# Patient Record
Sex: Male | Born: 1985 | Race: White | Hispanic: Yes | Marital: Married | Smoking: Current every day smoker
Health system: Southern US, Community
[De-identification: ages and names within clinical notes are randomized; demographics above are authoritative.]

## PROBLEM LIST (undated history)

## (undated) DIAGNOSIS — E162 Hypoglycemia, unspecified: Secondary | ICD-10-CM

## (undated) HISTORY — PX: OTHER SURGICAL HISTORY: SHX169

## (undated) HISTORY — PX: HEMATOMA EVACUATION: SHX5118

---

## 2012-10-29 ENCOUNTER — Encounter (HOSPITAL_COMMUNITY): Payer: Self-pay | Admitting: *Deleted

## 2012-10-29 ENCOUNTER — Emergency Department (HOSPITAL_COMMUNITY): Payer: Worker's Compensation

## 2012-10-29 ENCOUNTER — Emergency Department (HOSPITAL_COMMUNITY)
Admission: EM | Admit: 2012-10-29 | Discharge: 2012-10-29 | Disposition: A | Discharge status: Home / Self Care | Payer: Worker's Compensation | Attending: Emergency Medicine | Admitting: Emergency Medicine

## 2012-10-29 DIAGNOSIS — Z8639 Personal history of other endocrine, nutritional and metabolic disease: Secondary | ICD-10-CM | POA: Insufficient documentation

## 2012-10-29 DIAGNOSIS — W28XXXA Contact with powered lawn mower, initial encounter: Secondary | ICD-10-CM | POA: Insufficient documentation

## 2012-10-29 DIAGNOSIS — Y9389 Activity, other specified: Secondary | ICD-10-CM | POA: Insufficient documentation

## 2012-10-29 DIAGNOSIS — Y990 Civilian activity done for income or pay: Secondary | ICD-10-CM | POA: Insufficient documentation

## 2012-10-29 DIAGNOSIS — S60229A Contusion of unspecified hand, initial encounter: Secondary | ICD-10-CM | POA: Insufficient documentation

## 2012-10-29 DIAGNOSIS — Z862 Personal history of diseases of the blood and blood-forming organs and certain disorders involving the immune mechanism: Secondary | ICD-10-CM | POA: Insufficient documentation

## 2012-10-29 DIAGNOSIS — F17200 Nicotine dependence, unspecified, uncomplicated: Secondary | ICD-10-CM | POA: Insufficient documentation

## 2012-10-29 DIAGNOSIS — S60222A Contusion of left hand, initial encounter: Secondary | ICD-10-CM

## 2012-10-29 DIAGNOSIS — Y9289 Other specified places as the place of occurrence of the external cause: Secondary | ICD-10-CM | POA: Insufficient documentation

## 2012-10-29 HISTORY — DX: Hypoglycemia, unspecified: E16.2

## 2012-10-29 MED ORDER — DICLOFENAC SODIUM 75 MG PO TBEC: 75.0000 mg | DELAYED_RELEASE_TABLET | Freq: Two times a day (BID) | ORAL | Status: AC

## 2012-10-29 MED ORDER — HYDROCODONE-ACETAMINOPHEN 5-325 MG PO TABS: ORAL_TABLET | ORAL | Status: DC

## 2012-10-29 NOTE — ED Provider Notes (Signed)
History     CSN: [id number]  Arrival date & time 10/29/12  4098   First MD Initiated Contact with Patient 10/29/12 0800      Chief Complaint  Patient presents with  . Hand Injury    (Consider location/radiation/quality/duration/timing/severity/associated sxs/prior treatment) Patient is a 27 y.o. male presenting with hand injury. The history is provided by the patient.  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, mining transmissions Hand location:  L hand Pain details:    Quality:  Aching and throbbing   Severity:  Moderate   Onset quality:  Sudden   Timing:  Constant   Progression:  Worsening Chronicity:  New Handedness:  Ambidextrous Dislocation: no   Foreign body present:  No foreign bodies Prior injury to area:  No Relieved by:  Nothing Worsened by:  Movement Ineffective treatments:  NSAIDs and acetaminophen Associated symptoms: stiffness and tingling   Associated symptoms: no back pain and no neck pain   Risk factors: no known bone disorder and no frequent fractures     Past Medical History  Diagnosis Date  . Hypoglycemia     History reviewed. No pertinent past surgical history.  No family history on file.  History  Substance Use Topics  . Smoking status: Current Every Day Smoker  . Smokeless tobacco: Not on file  . Alcohol Use: No      Review of Systems  Constitutional: Negative for activity change.       All ROS Neg except as noted in HPI  HENT: Negative for nosebleeds and neck pain.   Eyes: Negative for photophobia and discharge.  Respiratory: Negative for cough, shortness of breath and wheezing.   Cardiovascular: Negative for chest pain and palpitations.  Gastrointestinal: Negative for abdominal pain and blood in stool.  Genitourinary: Negative for dysuria, frequency and hematuria.  Musculoskeletal: Positive for  stiffness. Negative for back pain and arthralgias.  Skin: Negative.   Neurological: Negative for dizziness, seizures and speech difficulty.  Psychiatric/Behavioral: Negative for hallucinations and confusion.    Allergies  Cinnamon and Penicillins  Home Medications  No current outpatient prescriptions on file.  BP 113/79  Pulse 68  Temp(Src) 98 F (36.7 C) (Oral)  Resp 16  Ht 5\' 10"  (1.778 m)  Wt 241 lb (109.317 kg)  BMI 34.58 kg/m2  SpO2 100%  Physical Exam  Nursing note and vitals reviewed. Constitutional: He is oriented to person, place, and time. He appears well-developed and well-nourished.  Non-toxic appearance.  HENT:  Head: Normocephalic.  Right Ear: Tympanic membrane and external ear normal.  Left Ear: Tympanic membrane and external ear normal.  Eyes: EOM and lids are normal. Pupils are equal, round, and reactive to light.  Neck: Normal range of motion. Neck supple. Carotid bruit is not present.  Cardiovascular: Normal rate, regular rhythm, normal heart sounds, intact distal pulses and normal pulses.   Pulmonary/Chest: Breath sounds normal. No respiratory distress.  Abdominal: Soft. Bowel sounds are normal. There is no tenderness. There is no guarding.  Musculoskeletal: Normal range of motion.  Bruise to the dorsum of the left hand. FROM of the fingers. No deformity or atrophy of the thenar areas. Cap refill less than 3 sec. Marland Kitchen FROM of the wrist, elbow and shoulder.   Lymphadenopathy:       Head (right side): No submandibular adenopathy present.  Head (left side): No submandibular adenopathy present.    He has no cervical adenopathy.  Neurological: He is alert and oriented to person, place, and time. He has normal strength. No cranial nerve deficit or sensory deficit.  Skin: Skin is warm and dry.  Psychiatric: He has a normal mood and affect. His speech is normal.    ED Course  Procedures (including critical care time)  Labs Reviewed - No data to  display Dg Hand Complete Left  10/29/2012  *RADIOLOGY REPORT*  Clinical Data: Left hand injury at work.  Pain and swelling of the middle finger.  LEFT HAND - COMPLETE 3+ VIEW  Comparison: None.  Findings: The mineralization and alignment are normal.  There is no evidence of acute fracture or dislocation.  No foreign body or focal soft tissue swelling is evident.  IMPRESSION: No acute osseous findings.   Original Report Authenticated By: Carey Bullocks, M.D.    Pulse oximetry 100% on room air. Within normal limits by my interpretation.  No diagnosis found.    MDM  I have reviewed nursing notes, vital signs, and all appropriate lab and imaging results for this patient. 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 X-ray of the left hand is negative for fracture or dislocation. Plan Rx for diclofenac and norco given. Pt referred to orthopedics if not improving.      Kathie Dike, PA-C 10/30/12 1646

## 2012-10-29 NOTE — ED Notes (Signed)
Pt slammed his left hand against transmissions at work, pt c/o pain that begins with left middle finger and extends up left hand, cms intact

## 2012-11-07 NOTE — ED Provider Notes (Signed)
Medical screening examination/treatment/procedure(s) were performed by non-physician practitioner and as supervising physician I was immediately available for consultation/collaboration.  Donnetta Hutching, MD 11/07/12 1209

## 2013-04-30 ENCOUNTER — Emergency Department: Payer: Self-pay | Admitting: Internal Medicine

## 2013-05-30 ENCOUNTER — Emergency Department: Payer: Self-pay | Admitting: Emergency Medicine

## 2013-08-30 ENCOUNTER — Emergency Department: Payer: Self-pay | Admitting: Emergency Medicine

## 2013-08-30 LAB — URINALYSIS, COMPLETE
BILIRUBIN, UR: NEGATIVE
BLOOD: NEGATIVE
Glucose,UR: NEGATIVE mg/dL (ref 0–75)
Leukocyte Esterase: NEGATIVE
Nitrite: NEGATIVE
PH: 5 (ref 4.5–8.0)
Protein: 30
SPECIFIC GRAVITY: 1.028 (ref 1.003–1.030)
Squamous Epithelial: 1

## 2013-08-30 LAB — CBC WITH DIFFERENTIAL/PLATELET
Basophil #: 0 10*3/uL (ref 0.0–0.1)
Basophil %: 0.4 %
EOS PCT: 4.4 %
Eosinophil #: 0.4 10*3/uL (ref 0.0–0.7)
HCT: 39.9 % — AB (ref 40.0–52.0)
HGB: 13.2 g/dL (ref 13.0–18.0)
LYMPHS ABS: 2.6 10*3/uL (ref 1.0–3.6)
Lymphocyte %: 26.8 %
MCH: 27.6 pg (ref 26.0–34.0)
MCHC: 33.1 g/dL (ref 32.0–36.0)
MCV: 84 fL (ref 80–100)
Monocyte #: 0.6 x10 3/mm (ref 0.2–1.0)
Monocyte %: 5.9 %
NEUTROS PCT: 62.5 %
Neutrophil #: 6.1 10*3/uL (ref 1.4–6.5)
Platelet: 277 10*3/uL (ref 150–440)
RBC: 4.78 10*6/uL (ref 4.40–5.90)
RDW: 15.3 % — AB (ref 11.5–14.5)
WBC: 9.8 10*3/uL (ref 3.8–10.6)

## 2013-08-30 LAB — COMPREHENSIVE METABOLIC PANEL
ALK PHOS: 142 U/L — AB
ALT: 25 U/L (ref 12–78)
Albumin: 3.9 g/dL (ref 3.4–5.0)
Anion Gap: 4 — ABNORMAL LOW (ref 7–16)
BUN: 14 mg/dL (ref 7–18)
Bilirubin,Total: 0.4 mg/dL (ref 0.2–1.0)
CALCIUM: 9.1 mg/dL (ref 8.5–10.1)
CHLORIDE: 106 mmol/L (ref 98–107)
Co2: 28 mmol/L (ref 21–32)
Creatinine: 0.7 mg/dL (ref 0.60–1.30)
EGFR (Non-African Amer.): >60
GLUCOSE: 101 mg/dL — AB (ref 65–99)
OSMOLALITY: 276 (ref 275–301)
Potassium: 4.2 mmol/L (ref 3.5–5.1)
SGOT(AST): 31 U/L (ref 15–37)
SODIUM: 138 mmol/L (ref 136–145)
Total Protein: 8.3 g/dL — ABNORMAL HIGH (ref 6.4–8.2)

## 2013-08-30 LAB — DRUG SCREEN, URINE
Amphetamines, Ur Screen: NEGATIVE (ref ?–1000)
BARBITURATES, UR SCREEN: NEGATIVE (ref ?–200)
Benzodiazepine, Ur Scrn: NEGATIVE (ref ?–200)
CANNABINOID 50 NG, UR ~~LOC~~: NEGATIVE (ref ?–50)
Cocaine Metabolite,Ur ~~LOC~~: NEGATIVE (ref ?–300)
MDMA (ECSTASY) UR SCREEN: NEGATIVE (ref ?–500)
Methadone, Ur Screen: NEGATIVE (ref ?–300)
OPIATE, UR SCREEN: NEGATIVE (ref ?–300)
PHENCYCLIDINE (PCP) UR S: NEGATIVE (ref ?–25)
Tricyclic, Ur Screen: NEGATIVE (ref ?–1000)

## 2013-08-30 LAB — LIPASE, BLOOD: LIPASE: 120 U/L (ref 73–393)

## 2013-11-27 ENCOUNTER — Emergency Department: Payer: Self-pay | Admitting: Emergency Medicine

## 2013-12-15 ENCOUNTER — Emergency Department: Payer: Self-pay | Admitting: Internal Medicine

## 2014-01-02 ENCOUNTER — Emergency Department: Payer: Self-pay | Admitting: Emergency Medicine

## 2014-01-02 LAB — CBC WITH DIFFERENTIAL/PLATELET
BASOS PCT: 0.4 %
Basophil #: 0.1 10*3/uL (ref 0.0–0.1)
EOS ABS: 0.3 10*3/uL (ref 0.0–0.7)
EOS PCT: 2.8 %
HCT: 38.2 % — ABNORMAL LOW (ref 40.0–52.0)
HGB: 12.3 g/dL — AB (ref 13.0–18.0)
Lymphocyte #: 3 10*3/uL (ref 1.0–3.6)
Lymphocyte %: 25.9 %
MCH: 26.9 pg (ref 26.0–34.0)
MCHC: 32.3 g/dL (ref 32.0–36.0)
MCV: 83 fL (ref 80–100)
MONOS PCT: 6.2 %
Monocyte #: 0.7 x10 3/mm (ref 0.2–1.0)
NEUTROS ABS: 7.5 10*3/uL — AB (ref 1.4–6.5)
Neutrophil %: 64.7 %
Platelet: 286 10*3/uL (ref 150–440)
RBC: 4.59 10*6/uL (ref 4.40–5.90)
RDW: 15.3 % — AB (ref 11.5–14.5)
WBC: 11.6 10*3/uL — ABNORMAL HIGH (ref 3.8–10.6)

## 2014-01-02 LAB — COMPREHENSIVE METABOLIC PANEL
Albumin: 3.6 g/dL (ref 3.4–5.0)
Alkaline Phosphatase: 137 U/L — ABNORMAL HIGH
Anion Gap: 3 — ABNORMAL LOW (ref 7–16)
BILIRUBIN TOTAL: 0.2 mg/dL (ref 0.2–1.0)
BUN: 15 mg/dL (ref 7–18)
CHLORIDE: 109 mmol/L — AB (ref 98–107)
CO2: 28 mmol/L (ref 21–32)
CREATININE: 0.84 mg/dL (ref 0.60–1.30)
Calcium, Total: 8.9 mg/dL (ref 8.5–10.1)
EGFR (Non-African Amer.): >60
GLUCOSE: 81 mg/dL (ref 65–99)
OSMOLALITY: 279 (ref 275–301)
Potassium: 3.8 mmol/L (ref 3.5–5.1)
SGOT(AST): 26 U/L (ref 15–37)
SGPT (ALT): 24 U/L (ref 12–78)
SODIUM: 140 mmol/L (ref 136–145)
Total Protein: 7.8 g/dL (ref 6.4–8.2)

## 2014-01-02 LAB — URINALYSIS, COMPLETE
BLOOD: NEGATIVE
Bacteria: NONE SEEN
Bilirubin,UR: NEGATIVE
Glucose,UR: NEGATIVE mg/dL (ref 0–75)
Ketone: NEGATIVE
Leukocyte Esterase: NEGATIVE
NITRITE: NEGATIVE
PH: 7 (ref 4.5–8.0)
PROTEIN: NEGATIVE
RBC,UR: <1 /HPF (ref 0–5)
SPECIFIC GRAVITY: 1.019 (ref 1.003–1.030)

## 2014-01-02 LAB — LIPASE, BLOOD: Lipase: 160 U/L (ref 73–393)

## 2015-05-13 ENCOUNTER — Ambulatory Visit (INDEPENDENT_AMBULATORY_CARE_PROVIDER_SITE_OTHER): Payer: BLUE CROSS/BLUE SHIELD | Admitting: *Deleted

## 2015-05-13 DIAGNOSIS — Z23 Encounter for immunization: Secondary | ICD-10-CM | POA: Diagnosis not present

## 2016-06-04 ENCOUNTER — Emergency Department (HOSPITAL_COMMUNITY)
Admission: EM | Admit: 2016-06-04 | Discharge: 2016-06-04 | Disposition: A | Discharge status: Home / Self Care | Payer: Self-pay | Attending: Emergency Medicine | Admitting: Emergency Medicine

## 2016-06-04 ENCOUNTER — Emergency Department (HOSPITAL_COMMUNITY): Payer: Self-pay

## 2016-06-04 ENCOUNTER — Encounter (HOSPITAL_COMMUNITY): Payer: Self-pay | Admitting: Emergency Medicine

## 2016-06-04 DIAGNOSIS — J069 Acute upper respiratory infection, unspecified: Secondary | ICD-10-CM | POA: Insufficient documentation

## 2016-06-04 DIAGNOSIS — J9801 Acute bronchospasm: Secondary | ICD-10-CM | POA: Insufficient documentation

## 2016-06-04 DIAGNOSIS — Z791 Long term (current) use of non-steroidal anti-inflammatories (NSAID): Secondary | ICD-10-CM | POA: Insufficient documentation

## 2016-06-04 DIAGNOSIS — Z87891 Personal history of nicotine dependence: Secondary | ICD-10-CM | POA: Insufficient documentation

## 2016-06-04 DIAGNOSIS — Z79899 Other long term (current) drug therapy: Secondary | ICD-10-CM | POA: Insufficient documentation

## 2016-06-04 MED ORDER — ALBUTEROL SULFATE HFA 108 (90 BASE) MCG/ACT IN AERS
2.0000 | INHALATION_SPRAY | RESPIRATORY_TRACT | Status: DC | PRN
  Administered 2016-06-04: 2 via RESPIRATORY_TRACT
  Filled 2016-06-04: qty 6.7

## 2016-06-04 MED ORDER — AMOXICILLIN 500 MG PO CAPS: 1000.0000 mg | ORAL_CAPSULE | Freq: Two times a day (BID) | ORAL | 0 refills | Status: AC

## 2016-06-04 NOTE — ED Provider Notes (Signed)
[facility]-EMERGENCY DEPT Provider Note   CSN: [id number] Arrival date & time: 06/04/16  1841     History   Chief Complaint Chief Complaint  Patient presents with  . Cough    HPI John Slaughterlex Tate is a 30 y.o. male.  Cough, congestion, sore throat, intermittent fever x 3 weeks. He has tried multiple OTC medications without relief. He feels the cough is getting worse and is here for evaluation. No vomiting, change in appetite. He is a smoker and stopped smoking at onset of symptoms.     Cough  Associated symptoms include rhinorrhea, sore throat and shortness of breath. Pertinent negatives include no chest pain and no chills.    History reviewed. No pertinent past medical history.  There are no active problems to display for this patient.   History reviewed. No pertinent surgical history.     Home Medications    Prior to Admission medications   Not on File    Family History Family History  Problem Relation Age of Onset  . Cancer Mother     Social History Social History  Substance Use Topics  . Smoking status: Former Games developermoker  . Smokeless tobacco: Never Used  . Alcohol use No     Allergies   Review of patient's allergies indicates no known allergies.   Review of Systems Review of Systems  Constitutional: Negative for chills and fever.  HENT: Positive for congestion, rhinorrhea and sore throat.   Respiratory: Positive for cough and shortness of breath.   Cardiovascular: Negative for chest pain.       Chest pain with cough.  Gastrointestinal: Negative.  Negative for nausea and vomiting.  Musculoskeletal: Negative.   Skin: Negative.   Neurological: Negative.      Physical Exam Updated Vital Signs BP 104/62   Pulse 97   Temp 98.4 F (36.9 C)   Ht 5\' 11"  (1.803 m)   Wt 119.3 kg   SpO2 95%   BMI 36.68 kg/m   Physical Exam  Constitutional: He is oriented to person, place, and time. He appears well-developed and well-nourished.  HENT:  Head:  Normocephalic.  Right Ear: Tympanic membrane normal.  Left Ear: Tympanic membrane normal.  Nose: Mucosal edema present.  Neck: Normal range of motion. Neck supple.  Cardiovascular: Normal rate and regular rhythm.   No murmur heard. Pulmonary/Chest: Effort normal. He has wheezes. He exhibits no tenderness.  Abdominal: Soft. Bowel sounds are normal. There is no tenderness. There is no rebound and no guarding.  Musculoskeletal: Normal range of motion.  Neurological: He is alert and oriented to person, place, and time.  Skin: Skin is warm and dry. No rash noted.  Psychiatric: He has a normal mood and affect.     ED Treatments / Results  Labs (all labs ordered are listed, but only abnormal results are displayed) Labs Reviewed - No data to display  EKG  EKG Interpretation None       Radiology No results found.  Procedures Procedures (including critical care time)  Medications Ordered in ED Medications - No data to display   Initial Impression / Assessment and Plan / ED Course  I have reviewed the triage vital signs and the nursing notes.  Pertinent labs & imaging results that were available during my care of the patient were reviewed by me and considered in my medical decision making (see chart for details).  Clinical Course    Patient with ongoing symptoms of URI, intermittent fevers, symptoms x 3 weeks. He is  wheezing today, expiratory. Will provide inhaler, abx, recommend continued OTC medications. Follow up with PCP  Final Clinical Impressions(s) / ED Diagnoses   Final diagnoses:  None   1. URI 2. Bronchospasm  New Prescriptions New Prescriptions   No medications on file     Elpidio AnisShari Theotis Gerdeman, Cordelia Poche-C 06/04/16 1942    Jacalyn LefevreJulie Haviland, MD 06/04/16 2145

## 2016-06-04 NOTE — ED Triage Notes (Signed)
Pt reports cough x 3 weeks. Non productive.

## 2018-07-17 ENCOUNTER — Emergency Department (HOSPITAL_COMMUNITY): Admission: EM | Admit: 2018-07-17 | Discharge: 2018-07-17 | Discharge status: Absent without leave | Payer: Self-pay

## 2018-07-17 ENCOUNTER — Other Ambulatory Visit: Payer: Self-pay

## 2018-07-17 ENCOUNTER — Emergency Department (HOSPITAL_COMMUNITY)
Admission: EM | Admit: 2018-07-17 | Discharge: 2018-07-17 | Disposition: A | Discharge status: Home / Self Care | Payer: Self-pay | Attending: Emergency Medicine | Admitting: Emergency Medicine

## 2018-07-17 ENCOUNTER — Encounter (HOSPITAL_COMMUNITY): Payer: Self-pay | Admitting: Emergency Medicine

## 2018-07-17 DIAGNOSIS — K0889 Other specified disorders of teeth and supporting structures: Secondary | ICD-10-CM | POA: Insufficient documentation

## 2018-07-17 DIAGNOSIS — F17210 Nicotine dependence, cigarettes, uncomplicated: Secondary | ICD-10-CM | POA: Insufficient documentation

## 2018-07-17 MED ORDER — NAPROXEN 500 MG PO TABS: 500.0000 mg | ORAL_TABLET | Freq: Two times a day (BID) | ORAL | 0 refills | Status: AC

## 2018-07-17 MED ORDER — CLINDAMYCIN HCL 150 MG PO CAPS: 450.0000 mg | ORAL_CAPSULE | Freq: Three times a day (TID) | ORAL | 0 refills | Status: AC

## 2018-07-17 MED ORDER — CHLORHEXIDINE GLUCONATE 0.12% ORAL RINSE (MEDLINE KIT): 15.0000 mL | Freq: Two times a day (BID) | OROMUCOSAL | 0 refills | Status: AC

## 2018-07-17 NOTE — ED Triage Notes (Signed)
Patient c/o right lower dental pain with swelling. Per patient intermittent x1 month but worse the past 2 days. Patient reports using multiple over the counter medications with no relief-[facility]. Per patient he has felt like his has had fever, chills.

## 2018-07-17 NOTE — Discharge Instructions (Addendum)
1. Medications: Clindamycin (antibiotic), naproxen for pain (do not combine with other medications except tylenol), chlorhexidine mouth solution, usual home medications 2. Treatment: rest, drink plenty of fluids, take medications as prescribed 3. Follow Up: Please follow up with dentistry within 1 week for discussion of your diagnoses and further evaluation after today's visit; if you do not have a primary care doctor use the resource guide provided to find one; Return to the ER for high fevers, difficulty breathing, difficulty swallowing or other concerning symptoms  Apply warm compresses to jaw throughout the day. Take antibiotic and completion. Follow up with a dentist is very important for ongoing evaluation and management of recurrent dental pain. Have her return to emergency department for emergent changing or worsening symptoms.  I have prescribed you an antibiotic to treat the infection and Naproxen which is an anti-inflammatory medicine to treat the pain.   Please take all of your antibiotics until finished. You may develop abdominal discomfort or diarrhea from the antibiotic.  You may help offset this with probiotics which you can buy at the store (ask your pharmacist if unable to find) or get probiotics in the form of eating yogurt. Do not eat or take the probiotics until 2 hours after your antibiotic. If you are unable to tolerate these side effects follow-up with your primary care provider or return to the emergency department.   If you begin to experience any blistering, rashes, swelling, or difficulty breathing seek medical care for evaluation of potentially more serious side effects.   Be sure to eat something when taking the Naproxen as it can cause stomach upset and at worst stomach bleeding. Do not take additional non steroidal anti-inflammatory medicines such as Ibuprofen, Aleve, Advil, Mobic, Diclofenac, or goodie powder while taking Naproxen. You may supplement with Tylenol.    Please be aware that these medications may interact with other medications you are taking, please be sure to discuss your medication list with your pharmacist.  If you start to experience and new or worsening symptoms return to the emergency department. If you start to experience fever, chills, neck stiffness/pain, or inability to move your neck or open your mouth come back to the emergency department immediately.   You have a dental problem. Use the resource guide listed below to help you find a dentist if you do not already have one to follow up with. It is very important that you get evaluated by a dentist as soon as possible. Call tomorrow to schedule an appointment. Read the instructions below.  Eat a soft or liquid diet and rinse your mouth out after meals with warm water. You should see a dentist or return here at once if you have increased swelling, increased pain or uncontrolled bleeding from the site of your injury.   SEEK MEDICAL CARE IF:  You have increased pain not controlled with medicines.  You have swelling around your tooth, in your face or neck.  You have bleeding which starts, continues, or gets worse.  You have a fever >101 If you are unable to open your mouth  RESOURCE GUIDE  Dental Problems  Patients with Medicaid: [facility]                     [facility] [phone]  [address] Phone:  [phone]                                                  Phone:  [phone]  If unable to pay or uninsured, contact:  [facility] or [facility]. to become qualified for the adult dental clinic.  Chronic Pain Problems Contact [facility]  [phone] Patients need to be referred by their primary care doctor.  Insufficient Money for Medicine Contact [employer]:  call "211" or [facility] [phone]) 419-5720.  No Primary Care Doctor Call [facility]   450-343-6980 Other agencies that provide inexpensive medical care    [facility]  [phone]    [facility]  [phone]    [facility]  [phone]    [facility]  564-642-9114    [facility]  979-597-6740    [facility]  [phone]  Psychological Services [facility]  [phone] [facility]  [phone] [facility]   [phone] (emergency services [phone])  Substance Abuse Resources [facility]  [phone] [facility] 7267565663 [facility] 7018778626 Floydene Flock ([phone] Residential & Outpatient Substance Abuse Program  616-668-3422  Abuse/Neglect [facility] Child Abuse Hotline 614-160-8516 [facility] Child Abuse Hotline 7657695264 (After Hours)  Emergency Shelter [facility] 4584529735  Maternity Homes [facility] at the [place] [facility] [place] 234-511-0260 [facility] (519)456-6218  MRSA Hotline #:   (929)565-6673    [facility] Resources  [facility]     [employer]                          [facility] Dept. [address] [place]                       [address]      [address]  Blondell Reveal Phone:  [phone]                                   Phone:  [phone]                 Phone:  [phone]  [facility] Phone:  [phone]  [facility] Child Abuse Hotline 604-132-9336 [phone] (After Hours)

## 2018-07-17 NOTE — ED Notes (Signed)
Pt returned call to er stating that he could not afford the prescription of clindamycin that was given to him, RN spoke with Dr Clarene Dukemcmanus who changed prescription to erythromycin 250 mg po every 6 hours for 5 days, [facility] [place] [facility]'s request pt was notified of change in prescription at [phone]

## 2018-07-17 NOTE — ED Provider Notes (Addendum)
[facility] EMERGENCY DEPARTMENT Provider Note   CSN: [id number] Arrival date & time: 07/17/18  5009     History   Chief Complaint Chief Complaint  Patient presents with  . Dental Pain    HPI John Tate is a 32 y.o. male presenting with right sided dental pain onset 1 month ago after breaking a tooth while eating. Patient describes pain as a constant throbbing and states it has worsened in the last 2 days. Patient has tried ibuprofen, motrin, and BC powder with partial relief. Patient states pain is worse with laying down and chewing. Patient states he has multiple broken teeth and cavities. Patient states he has a dentist appointment on January 12th when his insurance is accepted. Patient reports subjective fever, right sided facial edema, and slight trouble swallowing. Pt denies drooling, shortness of breath, neck pain, abdominal pain, nausea, or vomiting.   HPI  Past Medical History:  Diagnosis Date  . Hypoglycemia     There are no active problems to display for this patient.   Past Surgical History:  Procedure Laterality Date  . head surgery    . HEMATOMA EVACUATION          Home Medications    Prior to Admission medications   Medication Sig Start Date End Date Taking? Authorizing Provider  acetaminophen (TYLENOL) 500 MG tablet Take 2,000 mg by mouth every 6 (six) hours as needed for pain.   Yes [provider]  ibuprofen (ADVIL,MOTRIN) 200 MG tablet Take 800 mg by mouth every 6 (six) hours as needed for pain.   Yes [provider]  chlorhexidine gluconate, MEDLINE KIT, (PERIDEX) 0.12 % solution Use as directed 15 mLs in the mouth or throat 2 (two) times daily. 07/17/18   Darlin Drop P, PA-C  clindamycin (CLEOCIN) 150 MG capsule Take 3 capsules (450 mg total) by mouth 3 (three) times daily for 7 days. 07/17/18 07/24/18  Darlin Drop P, PA-C  naproxen (NAPROSYN) 500 MG tablet Take 1 tablet (500 mg total) by mouth 2 (two) times daily. 07/17/18    Arville Lime, PA-C    Family History Family History  Problem Relation Age of Onset  . Kidney failure Father     Social History Social History   Tobacco Use  . Smoking status: Current Every Day Smoker    Packs/day: 0.04    Years: 20.00    Pack years: 0.80    Types: Cigarettes  . Smokeless tobacco: Never Used  Substance Use Topics  . Alcohol use: No  . Drug use: No     Allergies   Cinnamon and Penicillins   Review of Systems Review of Systems  Constitutional: Positive for chills and fever. Negative for diaphoresis.  HENT: Positive for dental problem, facial swelling and trouble swallowing. Negative for drooling, ear pain, sore throat and voice change.   Respiratory: Negative for cough and shortness of breath.   Cardiovascular: Negative for chest pain.  Gastrointestinal: Negative for abdominal pain, nausea and vomiting.  Endocrine: Negative for cold intolerance and heat intolerance.  Musculoskeletal: Negative for neck pain.  Skin: Negative for color change.  Allergic/Immunologic: Negative for immunocompromised state.  Hematological: Negative for adenopathy.     Physical Exam Updated Vital Signs BP 118/87 (BP Location: Left Arm)   Pulse 71   Temp 97.9 F (36.6 C) (Oral)   Resp 18   Ht 5' 11"  (1.803 m)   Wt 118.8 kg   SpO2 97%   BMI 36.54 kg/m   Physical  Exam  Constitutional: Pt appears well-developed and well-nourished.  HENT:  Head: Normocephalic. Mild right sided facial edema noted. Right Ear: Tympanic membrane, external ear and ear canal normal.  Left Ear: Tympanic membrane, external ear and ear canal normal.  Nose: Nose normal. Right sinus exhibits no maxillary sinus tenderness and no frontal sinus tenderness. Left sinus exhibits no maxillary sinus tenderness and no frontal sinus tenderness.  Mouth/Throat: Uvula is midline, oropharynx is clear and moist and mucous membranes are normal. No oral lesions. Abnormal dentition. Dental caries and broken  teeth present. No uvula swelling or lacerations. No oropharyngeal exudate, posterior oropharyngeal edema, posterior oropharyngeal erythema or tonsillar abscesses.  No gingival swelling, fluctuance or induration. No gross abscess. No signs of Ludwig's angina noted. Eyes: Conjunctivae are normal. Pupils are equal, round, and reactive to light. Right eye exhibits no discharge. Left eye exhibits no discharge.  Neck: Normal range of motion. Neck supple. No stridor. Handling secretions without difficulty. No nuchal rigidity. No cervical lymphadenopathy. Cardiovascular: Normal rate, regular rhythm and normal heart sounds.   Pulmonary/Chest: Effort normal. No respiratory distress. Equal chest rise. Abdominal: Abdomen is soft and non tender. Pt exhibits no distension.  Lymphadenopathy: Pt has no cervical adenopathy.  Neurological: Pt is alert.  Skin: Skin is warm and dry.  Psychiatric: Pt has a normal mood and affect.  Nursing note and vitals reviewed.   ED Treatments / Results  Labs (all labs ordered are listed, but only abnormal results are displayed) Labs Reviewed - No data to display  EKG None  Radiology No results found.  Procedures Procedures (including critical care time)  Medications Ordered in ED Medications - No data to display   Initial Impression / Assessment and Plan / ED Course  I have reviewed the triage vital signs and the nursing notes.  Pertinent labs & imaging results that were available during my care of the patient were reviewed by me and considered in my medical decision making (see chart for details).    Patient with toothache with signs of infection.  No gross abscess.  Exam unconcerning for Ludwig's angina.  Will treat with clindamycin due to penicillin allergy and anti-inflammatories medicine.  Instructed patient to not combine over the counter Urged patient to follow-up with dentist. Advised patient to return to ER if he experiences new or worsening  symptoms.   Final Clinical Impressions(s) / ED Diagnoses   Final diagnoses:  Pain, dental    ED Discharge Orders         Ordered    naproxen (NAPROSYN) 500 MG tablet  2 times daily     07/17/18 1036    clindamycin (CLEOCIN) 150 MG capsule  3 times daily     07/17/18 1036    chlorhexidine gluconate, MEDLINE KIT, (PERIDEX) 0.12 % solution  2 times daily     07/17/18 1122              Darlin Drop [place], [place] 07/17/18 1122    Fredia Sorrow, MD 07/20/18 1052

## 2018-07-18 ENCOUNTER — Encounter (HOSPITAL_COMMUNITY): Payer: Self-pay | Admitting: Emergency Medicine

## 2019-05-16 ENCOUNTER — Other Ambulatory Visit: Payer: Self-pay | Admitting: *Deleted

## 2019-05-16 DIAGNOSIS — Z20822 Contact with and (suspected) exposure to covid-19: Secondary | ICD-10-CM

## 2019-05-18 LAB — NOVEL CORONAVIRUS, NAA: SARS-CoV-2, NAA: NOT DETECTED

## 2019-05-19 ENCOUNTER — Telehealth: Payer: Self-pay | Admitting: General Practice

## 2019-05-19 NOTE — Telephone Encounter (Signed)
Gave patient covid test results °Patient understood  °

## 2021-12-02 ENCOUNTER — Emergency Department (HOSPITAL_COMMUNITY)
Admission: EM | Admit: 2021-12-02 | Discharge: 2021-12-02 | Disposition: A | Discharge status: Home / Self Care | Payer: Self-pay | Attending: Emergency Medicine | Admitting: Emergency Medicine

## 2021-12-02 ENCOUNTER — Emergency Department (HOSPITAL_COMMUNITY): Payer: Self-pay

## 2021-12-02 ENCOUNTER — Encounter (HOSPITAL_COMMUNITY): Payer: Self-pay | Admitting: Emergency Medicine

## 2021-12-02 ENCOUNTER — Other Ambulatory Visit: Payer: Self-pay

## 2021-12-02 DIAGNOSIS — W3400XA Accidental discharge from unspecified firearms or gun, initial encounter: Secondary | ICD-10-CM | POA: Insufficient documentation

## 2021-12-02 DIAGNOSIS — S71102A Unspecified open wound, left thigh, initial encounter: Secondary | ICD-10-CM | POA: Insufficient documentation

## 2021-12-02 DIAGNOSIS — Z23 Encounter for immunization: Secondary | ICD-10-CM | POA: Insufficient documentation

## 2021-12-02 DIAGNOSIS — S71101A Unspecified open wound, right thigh, initial encounter: Secondary | ICD-10-CM | POA: Insufficient documentation

## 2021-12-02 LAB — CBC
HCT: 41.8 % (ref 39.0–52.0)
Hemoglobin: 13.8 g/dL (ref 13.0–17.0)
MCH: 29.2 pg (ref 26.0–34.0)
MCHC: 33 g/dL (ref 30.0–36.0)
MCV: 88.6 fL (ref 80.0–100.0)
Platelets: 278 10*3/uL (ref 150–400)
RBC: 4.72 MIL/uL (ref 4.22–5.81)
RDW: 14.3 % (ref 11.5–15.5)
WBC: 12.7 10*3/uL — ABNORMAL HIGH (ref 4.0–10.5)
nRBC: 0 % (ref 0.0–0.2)

## 2021-12-02 LAB — COMPREHENSIVE METABOLIC PANEL
ALT: 15 U/L (ref 0–44)
AST: 17 U/L (ref 15–41)
Albumin: 3.5 g/dL (ref 3.5–5.0)
Alkaline Phosphatase: 88 U/L (ref 38–126)
Anion gap: 5 (ref 5–15)
BUN: 10 mg/dL (ref 6–20)
CO2: 26 mmol/L (ref 22–32)
Calcium: 8.6 mg/dL — ABNORMAL LOW (ref 8.9–10.3)
Chloride: 108 mmol/L (ref 98–111)
Creatinine, Ser: 0.8 mg/dL (ref 0.61–1.24)
GFR, Estimated: >60 mL/min (ref >60)
Glucose, Bld: 119 mg/dL — ABNORMAL HIGH (ref 70–99)
Potassium: 3.7 mmol/L (ref 3.5–5.1)
Sodium: 139 mmol/L (ref 135–145)
Total Bilirubin: 0.4 mg/dL (ref 0.3–1.2)
Total Protein: 6.4 g/dL — ABNORMAL LOW (ref 6.5–8.1)

## 2021-12-02 LAB — LACTIC ACID, PLASMA: Lactic Acid, Venous: 1.2 mmol/L (ref 0.5–1.9)

## 2021-12-02 LAB — SAMPLE TO BLOOD BANK

## 2021-12-02 LAB — I-STAT CHEM 8, ED
BUN: 12 mg/dL (ref 6–20)
Calcium, Ion: 1.15 mmol/L (ref 1.15–1.40)
Chloride: 103 mmol/L (ref 98–111)
Creatinine, Ser: 0.7 mg/dL (ref 0.61–1.24)
Glucose, Bld: 113 mg/dL — ABNORMAL HIGH (ref 70–99)
HCT: 42 % (ref 39.0–52.0)
Hemoglobin: 14.3 g/dL (ref 13.0–17.0)
Potassium: 3.8 mmol/L (ref 3.5–5.1)
Sodium: 141 mmol/L (ref 135–145)
TCO2: 28 mmol/L (ref 22–32)

## 2021-12-02 LAB — ETHANOL: Alcohol, Ethyl (B): <10 mg/dL (ref <10)

## 2021-12-02 LAB — PROTIME-INR
INR: 1 (ref 0.8–1.2)
Prothrombin Time: 13.3 seconds (ref 11.4–15.2)

## 2021-12-02 MED ORDER — OXYCODONE-ACETAMINOPHEN 5-325 MG PO TABS: 1.0000 | ORAL_TABLET | Freq: Four times a day (QID) | ORAL | 0 refills | Status: AC | PRN

## 2021-12-02 MED ORDER — CEPHALEXIN 500 MG PO CAPS: 500.0000 mg | ORAL_CAPSULE | Freq: Four times a day (QID) | ORAL | 0 refills | Status: DC

## 2021-12-02 MED ORDER — TETANUS-DIPHTH-ACELL PERTUSSIS 5-2.5-18.5 LF-MCG/0.5 IM SUSY
0.5000 mL | PREFILLED_SYRINGE | Freq: Once | INTRAMUSCULAR | Status: AC
  Administered 2021-12-02: 0.5 mL via INTRAMUSCULAR
  Filled 2021-12-02: qty 0.5

## 2021-12-02 MED ORDER — OXYCODONE-ACETAMINOPHEN 5-325 MG PO TABS: 1.0000 | ORAL_TABLET | Freq: Four times a day (QID) | ORAL | 0 refills | Status: DC | PRN

## 2021-12-02 MED ORDER — HYDROCODONE-ACETAMINOPHEN 5-325 MG PO TABS
1.0000 | ORAL_TABLET | Freq: Once | ORAL | Status: AC
Start: 2021-12-02 — End: 2021-12-02
  Administered 2021-12-02: 1 via ORAL
  Filled 2021-12-02: qty 1

## 2021-12-02 MED ORDER — HYDROCODONE-ACETAMINOPHEN 5-325 MG PO TABS
1.0000 | ORAL_TABLET | Freq: Once | ORAL | Status: AC
  Administered 2021-12-02: 1 via ORAL
  Filled 2021-12-02: qty 1

## 2021-12-02 MED ORDER — CEPHALEXIN 500 MG PO CAPS: 500.0000 mg | ORAL_CAPSULE | Freq: Four times a day (QID) | ORAL | 0 refills | Status: AC

## 2021-12-02 NOTE — Discharge Instructions (Signed)
Keep wounds clean and dressed.  Ambulate as tolerated.  Take medications as prescribed.  Do not drive while taking Percocet. ?
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
## 2021-12-02 NOTE — ED Notes (Signed)
Attempted to ambulate pt

## 2021-12-02 NOTE — Progress Notes (Signed)
Orthopedic Tech Progress Note ?Patient Details:  ?John Tate ?May 10, 1986 ?[id number] ? ?Patient ID: John Tate, male   DOB: [birth year], 36 y.o.   MRN: [id number] ?Level I; not needed at the moment. ? ?[place] ?12/02/2021, 1:59 AM ? ?

## 2021-12-02 NOTE — ED Notes (Signed)
Dressing/Wound Care provided ?

## 2021-12-02 NOTE — ED Notes (Signed)
Crutches provided to pt. Pt. taught how to ambulate on crutches, teach back was observed.  ?
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
## 2023-01-12 ENCOUNTER — Emergency Department (HOSPITAL_BASED_OUTPATIENT_CLINIC_OR_DEPARTMENT_OTHER)
Admission: EM | Admit: 2023-01-12 | Discharge: 2023-01-13 | Disposition: A | Discharge status: Home / Self Care | Payer: Worker's Compensation | Attending: Emergency Medicine | Admitting: Emergency Medicine

## 2023-01-12 ENCOUNTER — Encounter (HOSPITAL_BASED_OUTPATIENT_CLINIC_OR_DEPARTMENT_OTHER): Payer: Self-pay

## 2023-01-12 ENCOUNTER — Other Ambulatory Visit: Payer: Self-pay

## 2023-01-12 DIAGNOSIS — W010XXA Fall on same level from slipping, tripping and stumbling without subsequent striking against object, initial encounter: Secondary | ICD-10-CM | POA: Insufficient documentation

## 2023-01-12 DIAGNOSIS — S8391XA Sprain of unspecified site of right knee, initial encounter: Secondary | ICD-10-CM | POA: Insufficient documentation

## 2023-01-12 DIAGNOSIS — M25561 Pain in right knee: Secondary | ICD-10-CM | POA: Diagnosis present
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
## 2023-01-13 ENCOUNTER — Emergency Department (HOSPITAL_BASED_OUTPATIENT_CLINIC_OR_DEPARTMENT_OTHER): Payer: Self-pay

## 2023-01-13 NOTE — Discharge Instructions (Signed)
Wear Ace bandage for comfort and support.  Rest.  Ice for 20 minutes every 2 hours while awake for the next 2 days.  Weightbearing as tolerated.  Take ibuprofen 600 mg every 6 hours as needed for pain.  Follow-up with primary doctor if symptoms or not improving in the next week.
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
## 2023-01-17 ENCOUNTER — Emergency Department (HOSPITAL_BASED_OUTPATIENT_CLINIC_OR_DEPARTMENT_OTHER)
Admission: EM | Admit: 2023-01-17 | Discharge: 2023-01-17 | Disposition: A | Discharge status: Home / Self Care | Payer: Worker's Compensation | Attending: Emergency Medicine | Admitting: Emergency Medicine

## 2023-01-17 ENCOUNTER — Encounter (HOSPITAL_BASED_OUTPATIENT_CLINIC_OR_DEPARTMENT_OTHER): Payer: Self-pay

## 2023-01-17 DIAGNOSIS — W010XXD Fall on same level from slipping, tripping and stumbling without subsequent striking against object, subsequent encounter: Secondary | ICD-10-CM | POA: Insufficient documentation

## 2023-01-17 DIAGNOSIS — S8391XD Sprain of unspecified site of right knee, subsequent encounter: Secondary | ICD-10-CM | POA: Diagnosis not present

## 2023-01-17 DIAGNOSIS — Y990 Civilian activity done for income or pay: Secondary | ICD-10-CM | POA: Diagnosis not present

## 2023-01-17 DIAGNOSIS — S8991XD Unspecified injury of right lower leg, subsequent encounter: Secondary | ICD-10-CM | POA: Diagnosis present

## 2023-01-17 NOTE — Discharge Instructions (Signed)
Alternate between Ibuprofen ant Tylenol every 4 hours as needed for pain.  Get help right away if: You can't use your knee to support your body weight for standing or walking. You can't move the injured area. Your knee buckles, or you have pain after you walk only a few steps. You have very bad pain or swelling, or you lose feeling in your leg below the cast, brace, or splint. Your foot or toes are numb, cold, or blue after you loosen your splint or brace.
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
## 2023-01-17 NOTE — ED Provider Notes (Signed)
[place] EMERGENCY DEPARTMENT AT [facility] Provider Note   CSN: [id number] Arrival date & time: 01/17/23  4098     History  Chief Complaint  Patient presents with   Knee Injury    John Tate is a 37 y.o. male who presents to the ED to follow up on his right knee sprain. Patient was seen in the ED 5 days ago when he slipped and fell, ultimately injuring his right knee. He reports that he needs a note for work stating he can return with no limitations. Patient admits that he has been icing his knee and taking NSAIDs as needed. He states that the pain and swelling have both improved. He was able to ambulate to the exam room on his own    Home Medications Prior to Admission medications   Medication Sig Start Date End Date Taking? Authorizing Provider  acetaminophen (TYLENOL) 325 MG tablet Take 650 mg by mouth every 6 (six) hours as needed for fever.    [provider]  acetaminophen (TYLENOL) 500 MG tablet Take 2,000 mg by mouth every 6 (six) hours as needed for pain.    [provider]  amoxicillin (AMOXIL) 500 MG capsule Take 2 capsules (1,000 mg total) by mouth 2 (two) times daily. 06/04/16   Elpidio Anis, PA-C  cephALEXin (KEFLEX) 500 MG capsule Take 1 capsule (500 mg total) by mouth 4 (four) times daily. 12/02/21   Horton, Mayer Masker, MD  chlorhexidine gluconate, MEDLINE KIT, (PERIDEX) 0.12 % solution Use as directed 15 mLs in the mouth or throat 2 (two) times daily. 07/17/18   Carlyle Basques P, PA-C  ibuprofen (ADVIL,MOTRIN) 200 MG tablet Take 800 mg by mouth every 6 (six) hours as needed for pain.    [provider]  ibuprofen (ADVIL,MOTRIN) 200 MG tablet Take 600 mg by mouth every 6 (six) hours as needed for fever.    [provider]  naproxen (NAPROSYN) 500 MG tablet Take 1 tablet (500 mg total) by mouth 2 (two) times daily. 07/17/18   Carlyle Basques P, PA-C  oxyCODONE-acetaminophen (PERCOCET/ROXICET) 5-325 MG tablet Take 1 tablet by  mouth every 6 (six) hours as needed for severe pain. 12/02/21   Horton, Mayer Masker, MD  testosterone cypionate (DEPOTESTOTERONE CYPIONATE) 100 MG/ML injection Inject 200 mg into the muscle every 7 (seven) days. For IM use only    [provider]      Allergies    Patient has no active allergies.    Review of Systems   Review of Systems  Musculoskeletal:        Follow up for right knee injury  All other systems reviewed and are negative.   Physical Exam Updated Vital Signs BP 110/64 (BP Location: Right Arm)   Pulse 69   Temp 98.7 F (37.1 C) (Oral)   Resp 18   SpO2 96%  Physical Exam Vitals and nursing note reviewed.  Constitutional:      Appearance: Normal appearance.  HENT:     Head: Normocephalic and atraumatic.     Mouth/Throat:     Mouth: Mucous membranes are moist.  Eyes:     Conjunctiva/sclera: Conjunctivae normal.     Pupils: Pupils are equal, round, and reactive to light.  Cardiovascular:     Rate and Rhythm: Normal rate and regular rhythm.     Pulses: Normal pulses.  Pulmonary:     Effort: Pulmonary effort is normal.     Breath sounds: Normal breath sounds.  Abdominal:  Palpations: Abdomen is soft.     Tenderness: There is no abdominal tenderness.  Musculoskeletal:        General: Normal range of motion.     Comments: No tenderness to right knee or popliteal fossa. Full range of motion appreciated.  Skin:    General: Skin is warm and dry.     Findings: Bruising present. No rash.     Comments: Bruising to the medial aspect of right knee without edema  Neurological:     General: No focal deficit present.     Mental Status: He is alert.  Psychiatric:        Mood and Affect: Mood normal.        Behavior: Behavior normal.     ED Results / Procedures / Treatments   Labs (all labs ordered are listed, but only abnormal results are displayed) Labs Reviewed - No data to display  EKG None  Radiology No results  found.  Procedures Procedures: not indicated.   Medications Ordered in ED Medications - No data to display  ED Course/ Medical Decision Making/ A&P                             Medical Decision Making  Patient is a 37 year old male who presents today for right knee sprain re-evaluation. He reports that he needs a letter stating he can return to work without limitations. I reviewed patient's prior ED visit note and imaging.   On exam, patient has bruising present to the medial right knee without swelling. Full range of motion of the knee. He was able to ambulate in the exam room today.  Advised patient to continue NSAIDs as needed. Work Physicist, medical provided. Patient stable for discharge home. Strict return precautions given.       Final Clinical Impression(s) / ED Diagnoses Final diagnoses:  None    Rx / DC Orders ED Discharge Orders     None         Maxwell Marion, PA-C 01/17/23 1006    Ernie Avena, MD 01/17/23 1031

## 2023-02-22 ENCOUNTER — Ambulatory Visit: Payer: 59 | Admitting: Orthopedic Surgery
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
## 2024-01-31 NOTE — Telephone Encounter (Addendum)
 Left voicemail in reference to missed appointment on 01/31/2024.   Mailed no show letter to address on file.

## 2024-02-09 ENCOUNTER — Other Ambulatory Visit: Payer: Self-pay

## 2024-02-09 ENCOUNTER — Emergency Department (HOSPITAL_COMMUNITY)
Admission: EM | Admit: 2024-02-09 | Discharge: 2024-02-10 | Disposition: A | Discharge status: Home / Self Care | Payer: Self-pay | Attending: Emergency Medicine | Admitting: Emergency Medicine

## 2024-02-09 ENCOUNTER — Encounter (HOSPITAL_COMMUNITY): Payer: Self-pay | Admitting: Emergency Medicine

## 2024-02-09 DIAGNOSIS — R202 Paresthesia of skin: Secondary | ICD-10-CM | POA: Insufficient documentation
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
## 2024-02-10 MED ORDER — GABAPENTIN 300 MG PO CAPS
300.0000 mg | ORAL_CAPSULE | Freq: Two times a day (BID) | ORAL | 0 refills | Status: AC
Start: 2024-02-10 — End: ?
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
## 2024-02-10 NOTE — Discharge Instructions (Signed)
 You were seen today for intermittent numbness and pains in the left leg and arm.  Your exam is reassuring.  Trial gabapentin  to see if this helps.  Follow-up with neurology.

## 2024-08-16 ENCOUNTER — Encounter: Payer: Self-pay | Admitting: Gastroenterology

## 2024-09-13 ENCOUNTER — Ambulatory Visit: Admitting: Gastroenterology

## 2024-10-11 ENCOUNTER — Ambulatory Visit: Admitting: "Endocrinology
